# Patient Record
Sex: Male | Born: 1995 | Race: Black or African American | Hispanic: No | Marital: Single
Health system: Southern US, Community
[De-identification: ages and names within clinical notes are randomized; demographics above are authoritative.]

## PROBLEM LIST (undated history)

## (undated) DIAGNOSIS — J45909 Unspecified asthma, uncomplicated: Secondary | ICD-10-CM

## (undated) DIAGNOSIS — T7840XA Allergy, unspecified, initial encounter: Secondary | ICD-10-CM

## (undated) DIAGNOSIS — L709 Acne, unspecified: Secondary | ICD-10-CM

## (undated) HISTORY — DX: Allergy, unspecified, initial encounter: T78.40XA

## (undated) HISTORY — DX: Acne, unspecified: L70.9

## (undated) HISTORY — PX: ADENOIDECTOMY: SUR15

---
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
## 2017-11-10 ENCOUNTER — Other Ambulatory Visit: Payer: Self-pay | Admitting: Pediatrics

## 2017-11-11 ENCOUNTER — Telehealth: Payer: Self-pay | Admitting: Licensed Clinical Social Worker

## 2017-11-11 ENCOUNTER — Encounter: Payer: Medicaid Other | Admitting: Licensed Clinical Social Worker

## 2017-11-11 ENCOUNTER — Ambulatory Visit (INDEPENDENT_AMBULATORY_CARE_PROVIDER_SITE_OTHER): Payer: Medicaid Other | Admitting: Pediatrics

## 2017-11-11 ENCOUNTER — Encounter: Payer: Self-pay | Admitting: Pediatrics

## 2017-11-11 VITALS — BP 121/78 | HR 75 | Ht 71.65 in | Wt 224.0 lb

## 2017-11-11 DIAGNOSIS — Z00121 Encounter for routine child health examination with abnormal findings: Secondary | ICD-10-CM

## 2017-11-11 DIAGNOSIS — L700 Acne vulgaris: Secondary | ICD-10-CM

## 2017-11-11 DIAGNOSIS — Z0001 Encounter for general adult medical examination with abnormal findings: Secondary | ICD-10-CM

## 2017-11-11 DIAGNOSIS — J302 Other seasonal allergic rhinitis: Secondary | ICD-10-CM

## 2017-11-11 DIAGNOSIS — Z68.41 Body mass index (BMI) pediatric, greater than or equal to 95th percentile for age: Secondary | ICD-10-CM

## 2017-11-11 DIAGNOSIS — Z113 Encounter for screening for infections with a predominantly sexual mode of transmission: Secondary | ICD-10-CM | POA: Diagnosis not present

## 2017-11-11 DIAGNOSIS — J4540 Moderate persistent asthma, uncomplicated: Secondary | ICD-10-CM

## 2017-11-11 DIAGNOSIS — IMO0002 Reserved for concepts with insufficient information to code with codable children: Secondary | ICD-10-CM

## 2017-11-11 LAB — POCT RAPID HIV: RAPID HIV, POC: NEGATIVE

## 2017-11-11 MED ORDER — FLUTICASONE PROPIONATE HFA 220 MCG/ACT IN AERO: INHALATION_SPRAY | RESPIRATORY_TRACT | 11 refills | Status: AC

## 2017-11-11 MED ORDER — ALBUTEROL SULFATE HFA 108 (90 BASE) MCG/ACT IN AERS: INHALATION_SPRAY | RESPIRATORY_TRACT | 2 refills | Status: AC

## 2017-11-11 MED ORDER — CETIRIZINE HCL 10 MG PO TABS: ORAL_TABLET | ORAL | 11 refills | Status: AC

## 2017-11-11 MED ORDER — FLUTICASONE PROPIONATE 50 MCG/ACT NA SUSP: NASAL | 11 refills | Status: AC

## 2017-11-11 MED ORDER — ADAPALENE 0.1 % EX CREA: TOPICAL_CREAM | Freq: Every day | CUTANEOUS | 6 refills | Status: AC

## 2017-11-11 NOTE — Telephone Encounter (Signed)
Encounter opened to enter results of PHQ-9 modified for teens. Score of 0, results in flowsheets.

## 2017-11-11 NOTE — Progress Notes (Signed)
Adolescent Well Care Visit Bobby Luna is a 22 y.o. male who is here for well care.    PCP:  Gregor Hams, NP   History was provided by the patient.  Confidentiality was discussed with the patient and, if applicable, with caregiver as well. Patient's personal or confidential phone number: [phone]   Current Issues: Current concerns include needs refills of meds for AR, Asthma and Acne.   Family history related to overweight/obesity: Obesity: yes, Mom Heart disease: yes, MGF Hypertension: yes, PGM, MGF Hyperlipidemia: yes, MGF Diabetes: yes, MGM  Nutrition: Nutrition/Eating Behaviors: 2 meals at home Adequate calcium in diet?: milk, cheese Supplements/ Vitamins: no  Exercise/ Media: Play any Sports?/ Exercise: uses gym at school Screen Time:  < 2 hours Media Rules or Monitoring?: yes  Sleep:  Sleep: no problems  Social Screening: Lives with:  Mom, sister and brother Parental relations:  good Activities, Work, and Regulatory affairs officer?: working at [employer] Concerns regarding behavior with peers?  no Stressors of note: no  Education: School Name: [employer] Grade: will be sophomore in fall, Advertising copywriter: doing well; no concerns School Behavior: doing well; no concerns   Confidential Social History: Tobacco?  no Secondhand smoke exposure?  no Drugs/ETOH?  no  Sexually Active?  Not currently but has been   Pregnancy Prevention: wants condoms  Safe at home, in school & in relationships?  Yes Safe to self?  Yes   Screenings: Patient has a dental home: yes  The patient completed the Rapid Assessment for Adolescent Preventive Services screening questionnaire and the following topics were identified as risk factors and discussed: birth control  In addition, the following topics were discussed as part of anticipatory guidance healthy eating, exercise and screen time.  PHQ-9 completed and results  indicated  No concerns for depression  Physical Exam:  Vitals:   11/11/17 0859  BP: 121/78  Pulse: 75  Weight: 224 lb (101.6 kg)  Height: 5' 11.65" (1.82 m)   BP 121/78   Pulse 75   Ht 5' 11.65" (1.82 m)   Wt 224 lb (101.6 kg)   BMI 30.67 kg/m  Body mass index: body mass index is 30.67 kg/m. Blood pressure percentiles are not available for patients who are 18 years or older.   Hearing Screening   Method: Audiometry   125Hz  250Hz  500Hz  1000Hz  2000Hz  3000Hz  4000Hz  6000Hz  8000Hz   Right ear:   20 20 20  20     Left ear:   20 20 20  20       Visual Acuity Screening   Right eye Left eye Both eyes  Without correction: 20/16 20/16 20/16   With correction:       General Appearance:   alert, pleasant, obese teen  HENT: Normocephalic, no obvious abnormality, conjunctiva clear, swollen nasal turbinates  Mouth:   Normal appearing teeth, no obvious discoloration, dental caries, or dental caps  Neck:   Supple; thyroid: no enlargement, symmetric, no tenderness/mass/nodules  Chest symm  Lungs:   Clear to auscultation bilaterally, normal work of breathing  Heart:   Regular rate and rhythm, S1 and S2 normal, no murmurs;   Abdomen:   Soft, non-tender, no mass, or organomegaly  GU normal male genitals, no testicular masses or hernia  Musculoskeletal:   Tone and strength strong and symmetrical, all extremities               Lymphatic:   No cervical adenopathy  Skin/Hair/Nails:   Skin warm, dry  and intact, no rashes, no bruises or petechiae,papulopustular acne lesions scattered on cheeks, pimples on forehead  Neurologic:   Strength, gait, and coordination normal and age-appropriate     Assessment and Plan:   Well adolescent AR- under control Asthm- mild persistent, no symptoms currently Moderate Acne   BMI is not appropriate for age  Counseled regarding 5-2-1-0 goals of healthy active living including:  - eating at least 5 fruits and vegetables a day - at least 1 hour of  activity - no sugary beverages - eating three meals each day with age-appropriate servings - age-appropriate screen time - age-appropriate sleep patterns   Hearing screening result:normal Vision screening result: normal  Immunizations up-to-date  Orders Placed This Encounter  Procedures  . C. trachomatis/N. gonorrhoeae RNA  . POCT Rapid HIV    Rx per orders for refills of Flovent, Albuterol, Cetirizine, Flonase, Differin  Return in 1 year for next Adolescent Wellness Exam, or sooner if needed   Gregor HamsJacqueline Innocence Schlotzhauer, PPCNP-BC

## 2017-11-11 NOTE — Patient Instructions (Signed)
Asthma, Adult Asthma is a condition of the lungs in which the airways tighten and narrow. Asthma can make it hard to breathe. Asthma cannot be cured, but medicine and lifestyle changes can help control it. Asthma may be started (triggered) by:  Animal skin flakes (dander).  Dust.  Cockroaches.  Pollen.  Mold.  Smoke.  Cleaning products.  Hair sprays or aerosol sprays.  Paint fumes or strong smells.  Cold air, weather changes, and winds.  Crying or laughing hard.  Stress.  Certain medicines or drugs.  Foods, such as dried fruit, potato chips, and sparkling grape juice.  Infections or conditions (colds, flu).  Exercise.  Certain medical conditions or diseases.  Exercise or tiring activities.  Follow these instructions at home:  Take medicine as told by your doctor.  Use a peak flow meter as told by your doctor. A peak flow meter is a tool that measures how well the lungs are working.  Record and keep track of the peak flow meter's readings.  Understand and use the asthma action plan. An asthma action plan is a written plan for taking care of your asthma and treating your attacks.  To help prevent asthma attacks: ? Do not smoke. Stay away from secondhand smoke. ? Change your heating and air conditioning filter often. ? Limit your use of fireplaces and wood stoves. ? Get rid of pests (such as roaches and mice) and their droppings. ? Throw away plants if you see mold on them. ? Clean your floors. Dust regularly. Use cleaning products that do not smell. ? Have someone vacuum when you are not home. Use a vacuum cleaner with a HEPA filter if possible. ? Replace carpet with wood, tile, or vinyl flooring. Carpet can trap animal skin flakes and dust. ? Use allergy-proof pillows, mattress covers, and box spring covers. ? Wash bed sheets and blankets every week in hot water and dry them in a dryer. ? Use blankets that are made of polyester or cotton. ? Clean bathrooms  and kitchens with bleach. If possible, have someone repaint the walls in these rooms with mold-resistant paint. Keep out of the rooms that are being cleaned and painted. ? Wash hands often. Contact a doctor if:  You have make a whistling sound when breaking (wheeze), have shortness of breath, or have a cough even if taking medicine to prevent attacks.  The colored mucus you cough up (sputum) is thicker than usual.  The colored mucus you cough up changes from clear or white to yellow, green, gray, or bloody.  You have problems from the medicine you are taking such as: ? A rash. ? Itching. ? Swelling. ? Trouble breathing.  You need reliever medicines more than 2-3 times a week.  Your peak flow measurement is still at 50-79% of your personal best after following the action plan for 1 hour.  You have a fever. Get help right away if:  You seem to be worse and are not responding to medicine during an asthma attack.  You are short of breath even at rest.  You get short of breath when doing very little activity.  You have trouble eating, drinking, or talking.  You have chest pain.  You have a fast heartbeat.  Your lips or fingernails start to turn blue.  You are light-headed, dizzy, or faint.  Your peak flow is less than 50% of your personal best. This information is not intended to replace advice given to you by your health care provider. Make   sure you discuss any questions you have with your health care provider. Document Released: 10/30/2007 Document Revised: 10/19/2015 Document Reviewed: 12/10/2012 Elsevier Interactive Patient Education  2017 [employer]      Acne Acne is a skin problem that causes small, red bumps (pimples). Acne happens when the tiny holes in your skin (pores) get blocked. Your pores may become red, sore, and swollen. They may also become infected. Acne is a common skin problem. It is especially common in teenagers. Acne usually goes away over  time. Follow these instructions at home: Good skin care is the most important thing you can do to treat your acne. Take care of your skin as told by your doctor. You may be told to do these things:  Wash your skin gently at least two times each day. You should also wash your skin: ? After you exercise. ? Before you go to bed.  Use mild soap.  Use a water-based skin moisturizer after you wash your skin.  Use a sunscreen or sunblock with SPF 30 or greater. This is very important if you are using acne medicines.  Choose cosmetics that will not plug your oil glands (are noncomedogenic).  Medicines  Take over-the-counter and prescription medicines only as told by your doctor.  If you were prescribed an antibiotic medicine, apply or take it as told by your doctor. Do not stop using the antibiotic even if your acne improves. General instructions  Keep your hair clean and off of your face. Shampoo your hair regularly. If you have oily hair, you may need to wash it every day.  Avoid leaning your chin or forehead on your hands.  Avoid wearing tight headbands or hats.  Avoid picking or squeezing your pimples. That can make your acne worse and cause scarring.  Keep all follow-up visits as told by your doctor. This is important.  Shave gently. Only shave when it is necessary.  Keep a food journal. This can help you to see if any foods are linked with your acne. Contact a doctor if:  Your acne is not better after eight weeks.  Your acne gets worse.  You have a large area of skin that is red or tender.  You think that you are having side effects from any acne medicine. This information is not intended to replace advice given to you by your health care provider. Make sure you discuss any questions you have with your health care provider. Document Released: 05/02/2011 Document Revised: 10/19/2015 Document Reviewed: 07/20/2014 Elsevier Interactive Patient Education  2018 [employer].     Allergic Rhinitis, Adult Allergic rhinitis is an allergic reaction that affects the mucous membrane inside the nose. It causes sneezing, a runny or stuffy nose, and the feeling of mucus going down the back of the throat (postnasal drip). Allergic rhinitis can be mild to severe. There are two types of allergic rhinitis:  Seasonal. This type is also called hay fever. It happens only during certain seasons.  Perennial. This type can happen at any time of the year.  What are the causes? This condition happens when the body's defense system (immune system) responds to certain harmless substances called allergens as though they were germs.  Seasonal allergic rhinitis is triggered by pollen, which can come from grasses, trees, and weeds. Perennial allergic rhinitis may be caused by:  House dust mites.  Pet dander.  Mold spores.  What are the signs or symptoms? Symptoms of this condition include:  Sneezing.  Runny or  stuffy nose (nasal congestion).  Postnasal drip.  Itchy nose.  Tearing of the eyes.  Trouble sleeping.  Daytime sleepiness.  How is this diagnosed? This condition may be diagnosed based on:  Your medical history.  A physical exam.  Tests to check for related conditions, such as: ? Asthma. ? Pink eye. ? Ear infection. ? Upper respiratory infection.  Tests to find out which allergens trigger your symptoms. These may include skin or blood tests.  How is this treated? There is no cure for this condition, but treatment can help control symptoms. Treatment may include:  Taking medicines that block allergy symptoms, such as antihistamines. Medicine may be given as a shot, nasal spray, or pill.  Avoiding the allergen.  Desensitization. This treatment involves getting ongoing shots until your body becomes less sensitive to the allergen. This treatment may be done if other treatments do not help.  If taking medicine and avoiding the allergen does  not work, new, stronger medicines may be prescribed.  Follow these instructions at home:  Find out what you are allergic to. Common allergens include smoke, dust, and pollen.  Avoid the things you are allergic to. These are some things you can do to help avoid allergens: ? Replace carpet with wood, tile, or vinyl flooring. Carpet can trap dander and dust. ? Do not smoke. Do not allow smoking in your home. ? Change your heating and air conditioning filter at least once a month. ? During allergy season:  Keep windows closed as much as possible.  Plan outdoor activities when pollen counts are lowest. This is usually during the evening hours.  When coming indoors, change clothing and shower before sitting on furniture or bedding.  Take over-the-counter and prescription medicines only as told by your health care provider.  Keep all follow-up visits as told by your health care provider. This is important. Contact a health care provider if:  You have a fever.  You develop a persistent cough.  You make whistling sounds when you breathe (you wheeze).  Your symptoms interfere with your normal daily activities. Get help right away if:  You have shortness of breath. Summary  This condition can be managed by taking medicines as directed and avoiding allergens.  Contact your health care provider if you develop a persistent cough or fever.  During allergy season, keep windows closed as much as possible. This information is not intended to replace advice given to you by your health care provider. Make sure you discuss any questions you have with your health care provider. Document Released: 02/05/2001 Document Revised: 06/20/2016 Document Reviewed: 06/20/2016 Elsevier Interactive Patient Education  [employer]2018 [employer] Inc.

## 2017-11-12 LAB — C. TRACHOMATIS/N. GONORRHOEAE RNA
C. TRACHOMATIS RNA, TMA: NOT DETECTED
N. GONORRHOEAE RNA, TMA: NOT DETECTED

## 2018-09-08 IMAGING — CR DG ANKLE COMPLETE 3+V*R*
3 series · 3 of 3 positions shown · non-contrast
Comparison: None.

CLINICAL DATA: Lateral right ankle pain following a basketball
injury yesterday.

EXAM:
RIGHT ANKLE - COMPLETE 3+ VIEW

[t ankle joint ap right]
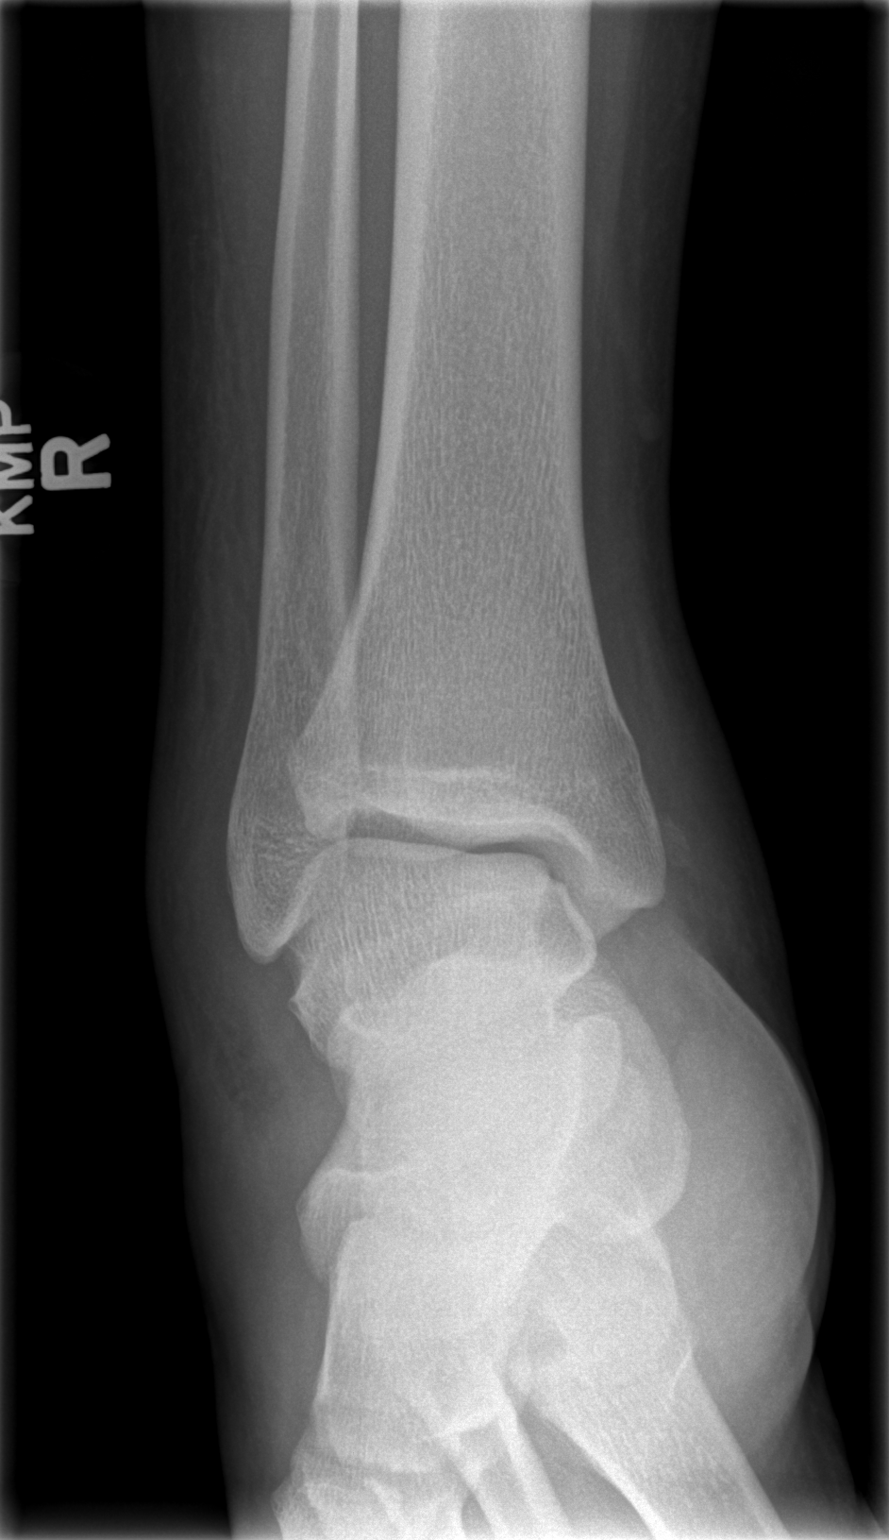

[t ankle joint oblique right]
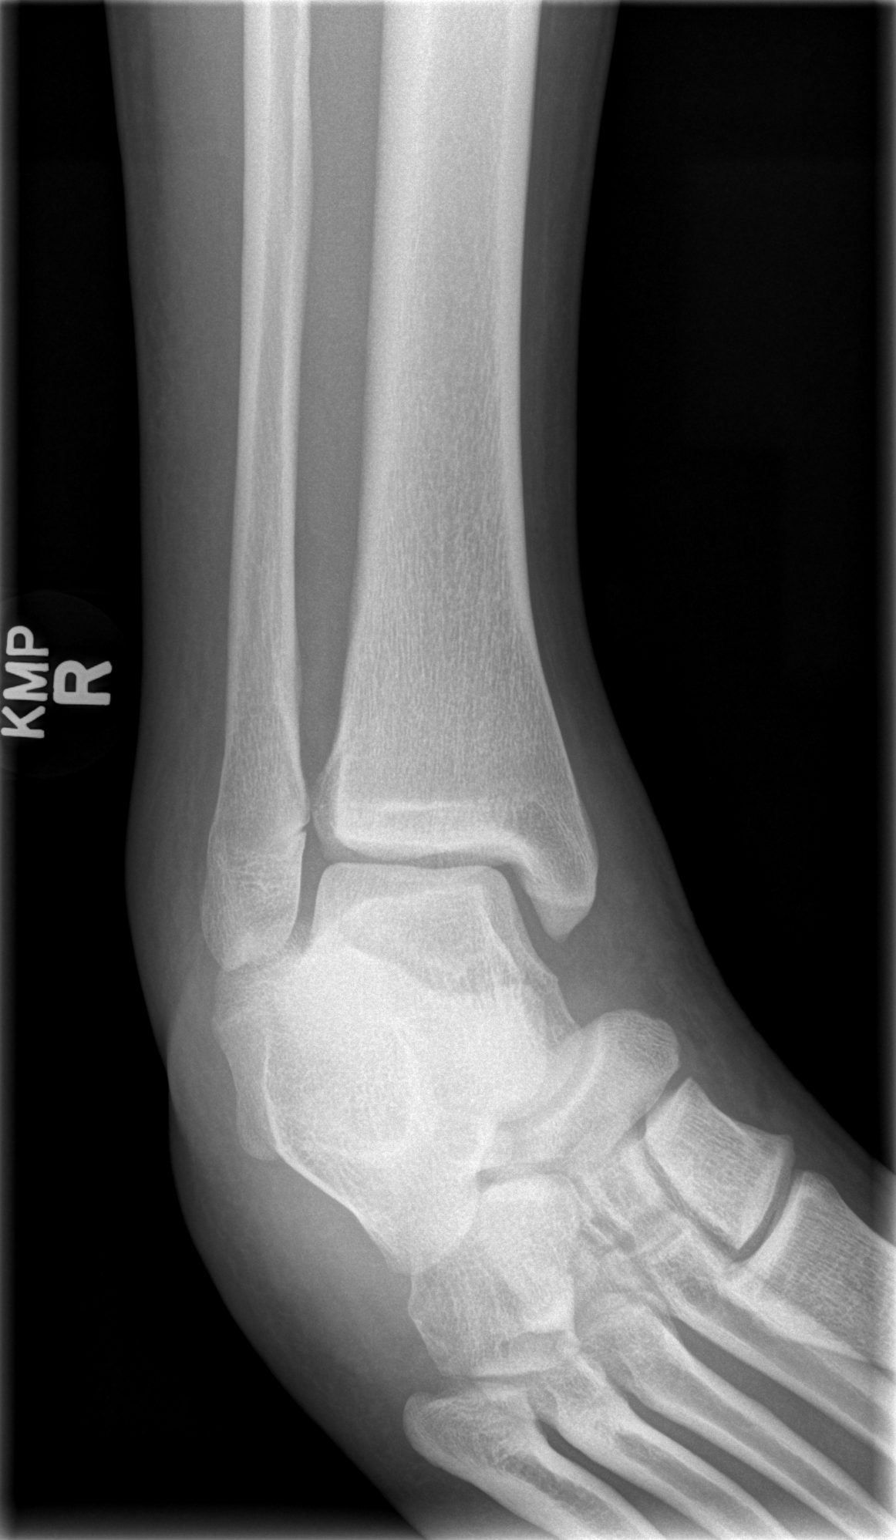

[t ankle joint lat right]
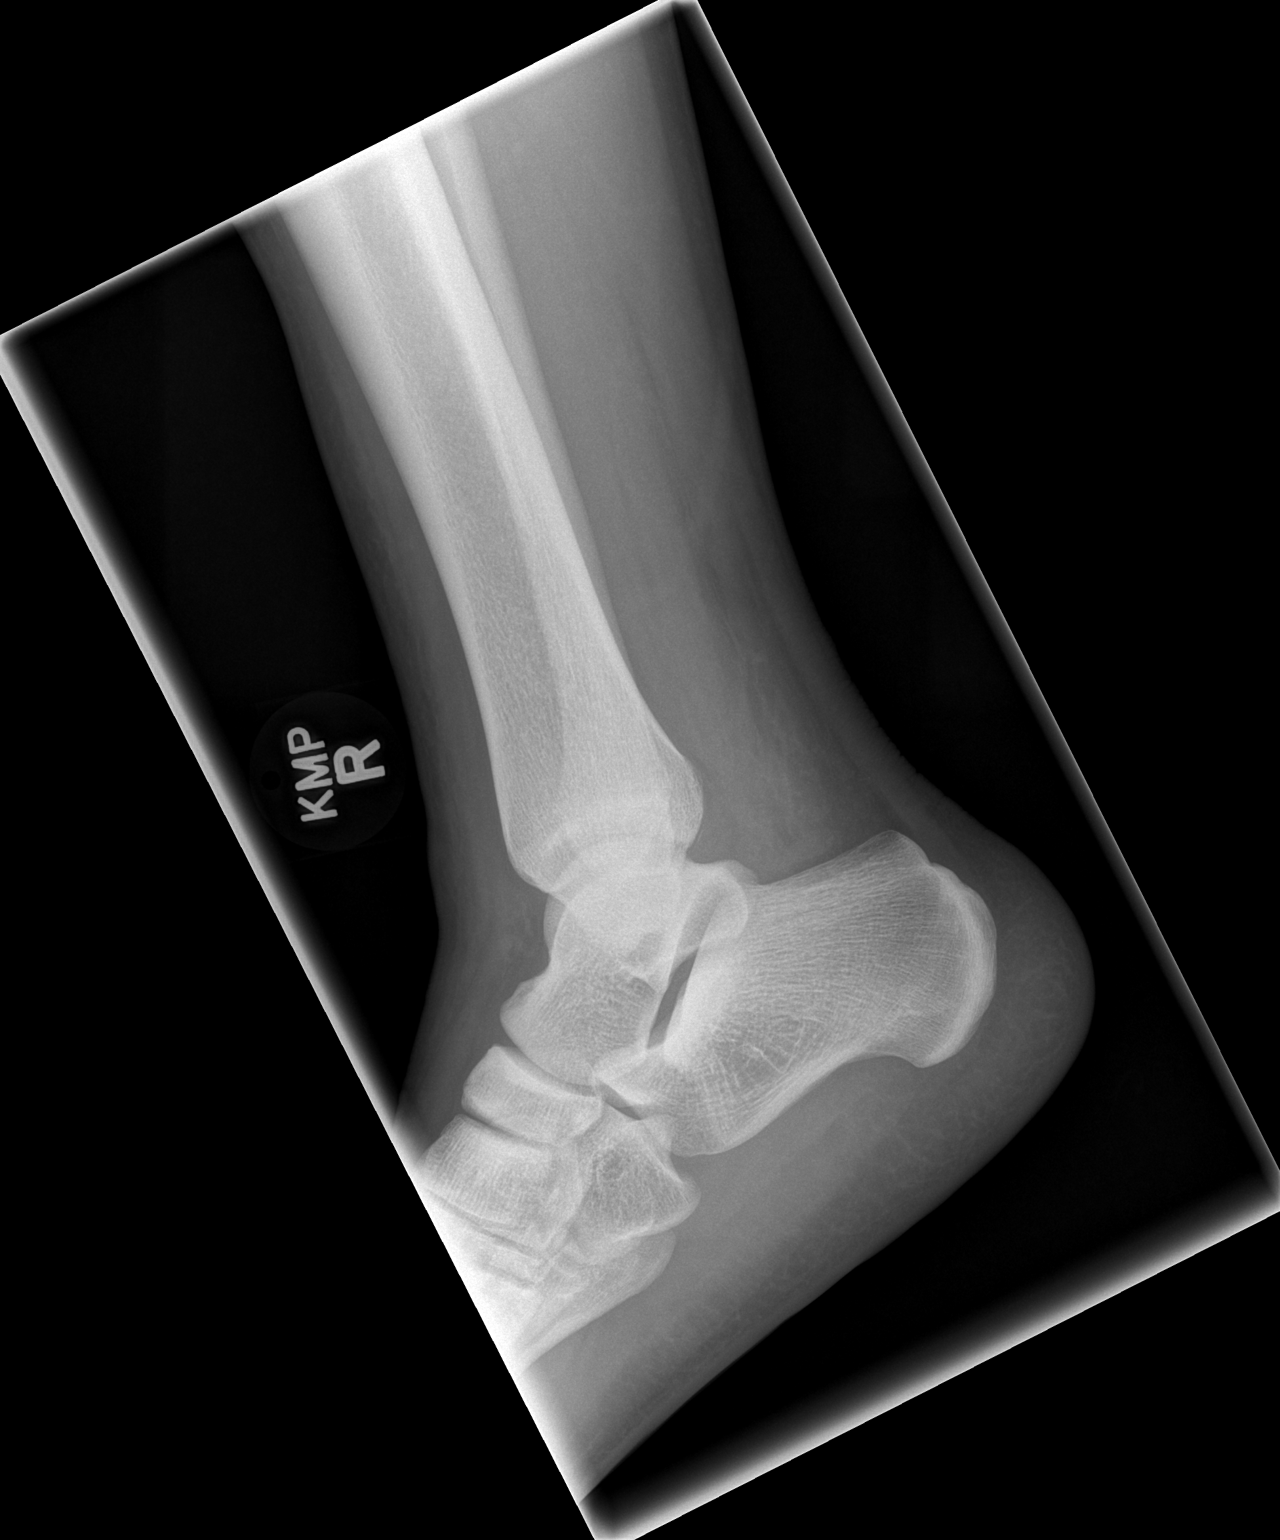

[3 of 3 positions shown; findings below may reference images not displayed]

FINDINGS: Diffuse soft tissue swelling. Small amount of ill-defined calcific
density lateral to the medial malleolus. Incompletely fused lateral
malleolus growth plate. No fracture or dislocation seen. No
effusion.
IMPRESSION: Small amount of calcific density adjacent to the medial aspect of
the medial malleolus. This could represent a small avulsion fracture
fragment or soft tissue calcification. Otherwise no fracture is
seen.

## 2019-10-11 ENCOUNTER — Encounter: Payer: Self-pay | Admitting: Pediatrics

## 2022-05-30 ENCOUNTER — Telehealth: Payer: Self-pay

## 2022-05-30 NOTE — Telephone Encounter (Signed)
Mychart msg sent. AS, CMA 

## 2024-02-17 ENCOUNTER — Ambulatory Visit (INDEPENDENT_AMBULATORY_CARE_PROVIDER_SITE_OTHER)

## 2024-02-17 ENCOUNTER — Ambulatory Visit

## 2024-02-17 VITALS — BP 114/70 | HR 60 | Temp 97.8°F | Resp 16 | Ht 72.0 in | Wt 216.0 lb

## 2024-02-17 DIAGNOSIS — G8929 Other chronic pain: Secondary | ICD-10-CM

## 2024-02-17 DIAGNOSIS — Z7689 Persons encountering health services in other specified circumstances: Secondary | ICD-10-CM

## 2024-02-17 DIAGNOSIS — L608 Other nail disorders: Secondary | ICD-10-CM | POA: Diagnosis not present

## 2024-02-17 DIAGNOSIS — M25562 Pain in left knee: Secondary | ICD-10-CM

## 2024-02-17 MED ORDER — TERBINAFINE HCL 250 MG PO TABS: 250.0000 mg | ORAL_TABLET | Freq: Every day | ORAL | 0 refills | Status: AC

## 2024-02-17 NOTE — Progress Notes (Signed)
 Patient ID: Bobby Luna, male    DOB: Jun 27, 1995  MRN: [id number]  CC: Establish Care   Subjective: Bobby Luna is a 28 y.o. male who presents to clinic to establish care.  Patient concerns include yellowing of his toenails as well as bruising of his right big toe nail.  Patient reports the yellowing has been present for several months.  He did sustain a injury to the right big toe about 7 years ago where the nail fell off.  The appearance of the nail is similar to then.  Patient other concern is chronic left knee pain that is worse when jumping and playing basketball.  Patient reports that pain improves but it is always present.  Had difficulty with knee pain in his teenage years and was told that it was growing pains.    Patient Active Problem List   Diagnosis Date Noted   BMI (body mass index), pediatric, 95-99% for age 85/18/2019   History of multiple concussions 10/16/2015   Asthma, moderate persistent 06/22/2014   Acne vulgaris 06/22/2014   Allergic rhinitis 07/19/2013     Current Outpatient Medications on File Prior to Visit  Medication Sig Dispense Refill   albuterol  (PROVENTIL  HFA) 108 (90 Base) MCG/ACT inhaler 2 puffs every 4-6 hours as needed for wheezing 13.4 g 2   adapalene  (DIFFERIN ) 0.1 % cream Apply topically at bedtime. 45 g 6   cetirizine  (ZYRTEC ) 10 MG tablet Take one tablet once daily as needed for allergies 30 tablet 11   fluticasone  (FLONASE ) 50 MCG/ACT nasal spray 2 sprays each nostril once daily for allergies 1 g 11   fluticasone  (FLOVENT  HFA) 220 MCG/ACT inhaler Inhale 2 puffs into lungs BID every day for asthma control 1 Inhaler 11   No current facility-administered medications on file prior to visit.    No Known Allergies  Social History   Socioeconomic History   Marital status: Single    Spouse name: Not on file   Number of children: Not on file   Years of education: Not on file   Highest education level: Not on file  Occupational History    Not on file  Tobacco Use   Smoking status: Passive Smoke Exposure - Never Smoker   Smokeless tobacco: Never   Tobacco comments:    outside smoker in the house  Substance and Sexual Activity   Alcohol use: No   Drug use: No   Sexual activity: Never  Other Topics Concern   Not on file  Social History Narrative   Lives with Mom and three sisters   Social Drivers of Corporate investment banker Strain: Not on file  Food Insecurity: Not on file  Transportation Needs: Not on file  Physical Activity: Not on file  Stress: Not on file  Social Connections: Not on file  Intimate Partner Violence: Not on file    Family History  Problem Relation Age of Onset   Asthma Mother    Alcohol abuse Father    Diabetes Maternal Grandmother    Hypertension Maternal Grandmother    Hypertension Maternal Grandfather    Heart disease Paternal Grandmother    Hypertension Paternal Grandmother    Hyperlipidemia Paternal Grandmother    Mental illness Paternal Grandmother     Past Surgical History:  Procedure Laterality Date   ADENOIDECTOMY      ROS: Review of Systems Negative except as stated above  PHYSICAL EXAM: BP 114/70   Pulse 60   Temp 97.8 F (  36.6 C) (Oral)   Resp 16   Ht 6' (1.829 m)   Wt 216 lb (98 kg)   SpO2 98%   BMI 29.29 kg/m   Physical Exam  General: well-appearing, no acute distress Skin: no jaundice, rashes, or lesions Cardiovascular: regular heart rate and rhythm, normal S1/S2, no murmurs, gallops, or rubs, peripheral pulses 2+ bilaterally Chest: no skeletal deformity, lungs clear to auscultation bilaterally, equal breath sounds bilaterally Musculoskeletal: left knee pain to palpation anteriorly, bony prominence left of tibial berosity Extremities: yellowing and thickening of toe nails bilaterally, ecchymosis of right first toe nail base    ASSESSMENT AND PLAN: 1. Encounter to establish care with new provider (Primary) - Follow up in a month for yearly  physical and labs  2. 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 Left knee xray to further evaluate. - DG Knee Complete 4 Views Left; Future  3. Discolored nails - Suspected onychomycosis  - terbinafine  (LAMISIL ) 250 MG tablet; Take 1 tablet (250 mg total) by mouth daily. For 12 weeks total.  Dispense: 90 tablet; Refill: 0     Patient was given the opportunity to ask questions.  Patient verbalized understanding of the plan and was able to repeat key elements of the plan.   No orders of the defined types were placed in this encounter.    Requested Prescriptions    No prescriptions requested or ordered in this encounter    No follow-ups on file.  Sula Leavy Rode, PA-C

## 2024-02-19 ENCOUNTER — Ambulatory Visit: Payer: Self-pay

## 2024-02-19 NOTE — Progress Notes (Signed)
  Your left knee xray shows ossification or bone growth in the patellar (below knee cap) ligament, which is common in people with history of Osgood-Schlatter disease. You can trial the knee strap listed above to see if it helps relieve some of your knee pain symptoms. You can also apply over the counter Voltaren gel (NSAID pain reliever cream) to your knee before and after activity to minimize pain and inflammation. Take Care!
